# Patient Record
Sex: Female | Born: 2000
Health system: Southern US, Community
[De-identification: ages and names within clinical notes are randomized; demographics above are authoritative.]

## PROBLEM LIST (undated history)

## (undated) DIAGNOSIS — F32A Depression, unspecified: Secondary | ICD-10-CM

## (undated) DIAGNOSIS — F419 Anxiety disorder, unspecified: Secondary | ICD-10-CM

## (undated) HISTORY — DX: Depression, unspecified: F32.A

## (undated) HISTORY — DX: Anxiety disorder, unspecified: F41.9

---
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
## 2021-01-05 DIAGNOSIS — F330 Major depressive disorder, recurrent, mild: Secondary | ICD-10-CM | POA: Diagnosis not present

## 2021-01-05 DIAGNOSIS — F4323 Adjustment disorder with mixed anxiety and depressed mood: Secondary | ICD-10-CM | POA: Diagnosis not present

## 2021-01-12 DIAGNOSIS — F4323 Adjustment disorder with mixed anxiety and depressed mood: Secondary | ICD-10-CM | POA: Diagnosis not present

## 2021-01-19 DIAGNOSIS — F4323 Adjustment disorder with mixed anxiety and depressed mood: Secondary | ICD-10-CM | POA: Diagnosis not present

## 2021-01-26 DIAGNOSIS — F4323 Adjustment disorder with mixed anxiety and depressed mood: Secondary | ICD-10-CM | POA: Diagnosis not present

## 2021-02-02 DIAGNOSIS — F4323 Adjustment disorder with mixed anxiety and depressed mood: Secondary | ICD-10-CM | POA: Diagnosis not present

## 2021-02-09 DIAGNOSIS — F4323 Adjustment disorder with mixed anxiety and depressed mood: Secondary | ICD-10-CM | POA: Diagnosis not present

## 2021-02-09 DIAGNOSIS — F330 Major depressive disorder, recurrent, mild: Secondary | ICD-10-CM | POA: Diagnosis not present

## 2021-02-25 ENCOUNTER — Other Ambulatory Visit: Payer: BC Managed Care – PPO

## 2021-08-19 ENCOUNTER — Ambulatory Visit: Payer: BC Managed Care – PPO | Admitting: Internal Medicine

## 2021-11-18 DIAGNOSIS — M25552 Pain in left hip: Secondary | ICD-10-CM | POA: Diagnosis not present

## 2021-11-18 DIAGNOSIS — F4323 Adjustment disorder with mixed anxiety and depressed mood: Secondary | ICD-10-CM | POA: Diagnosis not present

## 2021-11-24 DIAGNOSIS — R41840 Attention and concentration deficit: Secondary | ICD-10-CM | POA: Diagnosis not present

## 2021-11-25 DIAGNOSIS — F4323 Adjustment disorder with mixed anxiety and depressed mood: Secondary | ICD-10-CM | POA: Diagnosis not present

## 2021-12-02 DIAGNOSIS — F4323 Adjustment disorder with mixed anxiety and depressed mood: Secondary | ICD-10-CM | POA: Diagnosis not present

## 2021-12-09 DIAGNOSIS — F4323 Adjustment disorder with mixed anxiety and depressed mood: Secondary | ICD-10-CM | POA: Diagnosis not present

## 2021-12-16 DIAGNOSIS — F4323 Adjustment disorder with mixed anxiety and depressed mood: Secondary | ICD-10-CM | POA: Diagnosis not present

## 2021-12-23 DIAGNOSIS — F4323 Adjustment disorder with mixed anxiety and depressed mood: Secondary | ICD-10-CM | POA: Diagnosis not present

## 2022-01-06 DIAGNOSIS — F4323 Adjustment disorder with mixed anxiety and depressed mood: Secondary | ICD-10-CM | POA: Diagnosis not present

## 2022-01-11 DIAGNOSIS — J069 Acute upper respiratory infection, unspecified: Secondary | ICD-10-CM | POA: Diagnosis not present

## 2022-01-13 DIAGNOSIS — F603 Borderline personality disorder: Secondary | ICD-10-CM | POA: Diagnosis not present

## 2022-01-27 DIAGNOSIS — F603 Borderline personality disorder: Secondary | ICD-10-CM | POA: Diagnosis not present

## 2022-01-31 DIAGNOSIS — F411 Generalized anxiety disorder: Secondary | ICD-10-CM | POA: Diagnosis not present

## 2022-01-31 DIAGNOSIS — G44209 Tension-type headache, unspecified, not intractable: Secondary | ICD-10-CM | POA: Diagnosis not present

## 2022-01-31 DIAGNOSIS — M25532 Pain in left wrist: Secondary | ICD-10-CM | POA: Diagnosis not present

## 2022-01-31 DIAGNOSIS — R42 Dizziness and giddiness: Secondary | ICD-10-CM | POA: Diagnosis not present

## 2022-01-31 DIAGNOSIS — R7301 Impaired fasting glucose: Secondary | ICD-10-CM | POA: Diagnosis not present

## 2022-01-31 DIAGNOSIS — R55 Syncope and collapse: Secondary | ICD-10-CM | POA: Diagnosis not present

## 2022-02-01 DIAGNOSIS — G44209 Tension-type headache, unspecified, not intractable: Secondary | ICD-10-CM | POA: Diagnosis not present

## 2022-02-01 DIAGNOSIS — R42 Dizziness and giddiness: Secondary | ICD-10-CM | POA: Diagnosis not present

## 2022-02-01 DIAGNOSIS — Z23 Encounter for immunization: Secondary | ICD-10-CM | POA: Diagnosis not present

## 2022-02-01 DIAGNOSIS — R55 Syncope and collapse: Secondary | ICD-10-CM | POA: Diagnosis not present

## 2022-02-10 DIAGNOSIS — F603 Borderline personality disorder: Secondary | ICD-10-CM | POA: Diagnosis not present

## 2022-02-17 DIAGNOSIS — F603 Borderline personality disorder: Secondary | ICD-10-CM | POA: Diagnosis not present

## 2022-02-24 DIAGNOSIS — F603 Borderline personality disorder: Secondary | ICD-10-CM | POA: Diagnosis not present

## 2022-03-02 DIAGNOSIS — F411 Generalized anxiety disorder: Secondary | ICD-10-CM | POA: Diagnosis not present

## 2022-03-03 DIAGNOSIS — F603 Borderline personality disorder: Secondary | ICD-10-CM | POA: Diagnosis not present

## 2022-03-23 DIAGNOSIS — F603 Borderline personality disorder: Secondary | ICD-10-CM | POA: Diagnosis not present

## 2022-04-01 DIAGNOSIS — F603 Borderline personality disorder: Secondary | ICD-10-CM | POA: Diagnosis not present

## 2022-04-08 DIAGNOSIS — F603 Borderline personality disorder: Secondary | ICD-10-CM | POA: Diagnosis not present

## 2022-04-15 DIAGNOSIS — F603 Borderline personality disorder: Secondary | ICD-10-CM | POA: Diagnosis not present

## 2022-04-22 DIAGNOSIS — F603 Borderline personality disorder: Secondary | ICD-10-CM | POA: Diagnosis not present

## 2022-05-04 DIAGNOSIS — F603 Borderline personality disorder: Secondary | ICD-10-CM | POA: Diagnosis not present

## 2022-05-13 DIAGNOSIS — F603 Borderline personality disorder: Secondary | ICD-10-CM | POA: Diagnosis not present

## 2022-05-19 DIAGNOSIS — F603 Borderline personality disorder: Secondary | ICD-10-CM | POA: Diagnosis not present

## 2022-05-27 DIAGNOSIS — F603 Borderline personality disorder: Secondary | ICD-10-CM | POA: Diagnosis not present

## 2022-06-03 DIAGNOSIS — F603 Borderline personality disorder: Secondary | ICD-10-CM | POA: Diagnosis not present

## 2022-06-10 DIAGNOSIS — F603 Borderline personality disorder: Secondary | ICD-10-CM | POA: Diagnosis not present

## 2022-06-22 DIAGNOSIS — F603 Borderline personality disorder: Secondary | ICD-10-CM | POA: Diagnosis not present

## 2022-07-01 DIAGNOSIS — F603 Borderline personality disorder: Secondary | ICD-10-CM | POA: Diagnosis not present

## 2022-07-08 DIAGNOSIS — F603 Borderline personality disorder: Secondary | ICD-10-CM | POA: Diagnosis not present

## 2022-07-11 DIAGNOSIS — F603 Borderline personality disorder: Secondary | ICD-10-CM | POA: Diagnosis not present

## 2022-07-14 DIAGNOSIS — F603 Borderline personality disorder: Secondary | ICD-10-CM | POA: Diagnosis not present

## 2022-07-15 DIAGNOSIS — G4700 Insomnia, unspecified: Secondary | ICD-10-CM | POA: Diagnosis not present

## 2022-07-15 DIAGNOSIS — E063 Autoimmune thyroiditis: Secondary | ICD-10-CM | POA: Diagnosis not present

## 2022-07-15 DIAGNOSIS — F411 Generalized anxiety disorder: Secondary | ICD-10-CM | POA: Diagnosis not present

## 2022-07-18 DIAGNOSIS — R5383 Other fatigue: Secondary | ICD-10-CM | POA: Diagnosis not present

## 2022-07-18 DIAGNOSIS — E063 Autoimmune thyroiditis: Secondary | ICD-10-CM | POA: Diagnosis not present

## 2022-07-18 DIAGNOSIS — Z1321 Encounter for screening for nutritional disorder: Secondary | ICD-10-CM | POA: Diagnosis not present

## 2022-08-10 DIAGNOSIS — Z0000 Encounter for general adult medical examination without abnormal findings: Secondary | ICD-10-CM | POA: Diagnosis not present

## 2022-08-10 DIAGNOSIS — Z713 Dietary counseling and surveillance: Secondary | ICD-10-CM | POA: Diagnosis not present

## 2022-08-10 DIAGNOSIS — Z113 Encounter for screening for infections with a predominantly sexual mode of transmission: Secondary | ICD-10-CM | POA: Diagnosis not present

## 2022-08-10 DIAGNOSIS — Z1330 Encounter for screening examination for mental health and behavioral disorders, unspecified: Secondary | ICD-10-CM | POA: Diagnosis not present

## 2022-08-10 DIAGNOSIS — Z7189 Other specified counseling: Secondary | ICD-10-CM | POA: Diagnosis not present

## 2022-08-11 DIAGNOSIS — F603 Borderline personality disorder: Secondary | ICD-10-CM | POA: Diagnosis not present

## 2022-08-16 DIAGNOSIS — F603 Borderline personality disorder: Secondary | ICD-10-CM | POA: Diagnosis not present

## 2022-08-24 DIAGNOSIS — F603 Borderline personality disorder: Secondary | ICD-10-CM | POA: Diagnosis not present

## 2022-09-01 DIAGNOSIS — F603 Borderline personality disorder: Secondary | ICD-10-CM | POA: Diagnosis not present

## 2022-09-07 DIAGNOSIS — F603 Borderline personality disorder: Secondary | ICD-10-CM | POA: Diagnosis not present

## 2022-09-21 DIAGNOSIS — F603 Borderline personality disorder: Secondary | ICD-10-CM | POA: Diagnosis not present

## 2022-09-29 DIAGNOSIS — F603 Borderline personality disorder: Secondary | ICD-10-CM | POA: Diagnosis not present

## 2022-10-06 DIAGNOSIS — F603 Borderline personality disorder: Secondary | ICD-10-CM | POA: Diagnosis not present

## 2022-10-13 DIAGNOSIS — F603 Borderline personality disorder: Secondary | ICD-10-CM | POA: Diagnosis not present

## 2022-10-18 DIAGNOSIS — Z23 Encounter for immunization: Secondary | ICD-10-CM | POA: Diagnosis not present

## 2022-10-20 DIAGNOSIS — F603 Borderline personality disorder: Secondary | ICD-10-CM | POA: Diagnosis not present

## 2022-11-10 DIAGNOSIS — F603 Borderline personality disorder: Secondary | ICD-10-CM | POA: Diagnosis not present

## 2022-11-16 DIAGNOSIS — F603 Borderline personality disorder: Secondary | ICD-10-CM | POA: Diagnosis not present

## 2022-11-23 DIAGNOSIS — F603 Borderline personality disorder: Secondary | ICD-10-CM | POA: Diagnosis not present

## 2022-11-30 DIAGNOSIS — F603 Borderline personality disorder: Secondary | ICD-10-CM | POA: Diagnosis not present

## 2022-12-07 DIAGNOSIS — F603 Borderline personality disorder: Secondary | ICD-10-CM | POA: Diagnosis not present

## 2022-12-21 DIAGNOSIS — F603 Borderline personality disorder: Secondary | ICD-10-CM | POA: Diagnosis not present

## 2022-12-28 DIAGNOSIS — F603 Borderline personality disorder: Secondary | ICD-10-CM | POA: Diagnosis not present

## 2023-01-11 DIAGNOSIS — F603 Borderline personality disorder: Secondary | ICD-10-CM | POA: Diagnosis not present

## 2023-01-18 DIAGNOSIS — F603 Borderline personality disorder: Secondary | ICD-10-CM | POA: Diagnosis not present

## 2023-01-25 DIAGNOSIS — F603 Borderline personality disorder: Secondary | ICD-10-CM | POA: Diagnosis not present

## 2023-02-01 DIAGNOSIS — F603 Borderline personality disorder: Secondary | ICD-10-CM | POA: Diagnosis not present

## 2023-02-08 DIAGNOSIS — F603 Borderline personality disorder: Secondary | ICD-10-CM | POA: Diagnosis not present

## 2023-02-15 DIAGNOSIS — F603 Borderline personality disorder: Secondary | ICD-10-CM | POA: Diagnosis not present

## 2023-02-22 DIAGNOSIS — F603 Borderline personality disorder: Secondary | ICD-10-CM | POA: Diagnosis not present

## 2023-03-01 DIAGNOSIS — F603 Borderline personality disorder: Secondary | ICD-10-CM | POA: Diagnosis not present

## 2023-03-23 DIAGNOSIS — F603 Borderline personality disorder: Secondary | ICD-10-CM | POA: Diagnosis not present

## 2023-04-06 DIAGNOSIS — F603 Borderline personality disorder: Secondary | ICD-10-CM | POA: Diagnosis not present

## 2023-04-20 DIAGNOSIS — F603 Borderline personality disorder: Secondary | ICD-10-CM | POA: Diagnosis not present

## 2023-04-25 DIAGNOSIS — F603 Borderline personality disorder: Secondary | ICD-10-CM | POA: Diagnosis not present

## 2023-05-04 DIAGNOSIS — F603 Borderline personality disorder: Secondary | ICD-10-CM | POA: Diagnosis not present

## 2023-05-25 DIAGNOSIS — F603 Borderline personality disorder: Secondary | ICD-10-CM | POA: Diagnosis not present

## 2023-06-15 DIAGNOSIS — F603 Borderline personality disorder: Secondary | ICD-10-CM | POA: Diagnosis not present

## 2023-06-29 DIAGNOSIS — F603 Borderline personality disorder: Secondary | ICD-10-CM | POA: Diagnosis not present

## 2023-07-14 DIAGNOSIS — F603 Borderline personality disorder: Secondary | ICD-10-CM | POA: Diagnosis not present

## 2023-07-18 DIAGNOSIS — F603 Borderline personality disorder: Secondary | ICD-10-CM | POA: Diagnosis not present

## 2023-07-27 DIAGNOSIS — F603 Borderline personality disorder: Secondary | ICD-10-CM | POA: Diagnosis not present

## 2023-09-01 DIAGNOSIS — F603 Borderline personality disorder: Secondary | ICD-10-CM | POA: Diagnosis not present

## 2023-10-06 DIAGNOSIS — F603 Borderline personality disorder: Secondary | ICD-10-CM | POA: Diagnosis not present

## 2023-10-13 ENCOUNTER — Encounter: Payer: Self-pay | Admitting: Internal Medicine

## 2023-10-13 ENCOUNTER — Other Ambulatory Visit (HOSPITAL_COMMUNITY)
Admission: RE | Admit: 2023-10-13 | Discharge: 2023-10-13 | Disposition: A | Discharge status: Home / Self Care | Source: Ambulatory Visit | Attending: Internal Medicine | Admitting: Internal Medicine

## 2023-10-13 ENCOUNTER — Ambulatory Visit: Admitting: Internal Medicine

## 2023-10-13 ENCOUNTER — Other Ambulatory Visit: Payer: Self-pay

## 2023-10-13 VITALS — BP 120/70 | HR 97 | Temp 98.0°F | Resp 16 | Ht 62.0 in | Wt 139.1 lb

## 2023-10-13 DIAGNOSIS — Z0000 Encounter for general adult medical examination without abnormal findings: Secondary | ICD-10-CM | POA: Diagnosis not present

## 2023-10-13 DIAGNOSIS — Z1159 Encounter for screening for other viral diseases: Secondary | ICD-10-CM | POA: Diagnosis not present

## 2023-10-13 DIAGNOSIS — F419 Anxiety disorder, unspecified: Secondary | ICD-10-CM | POA: Diagnosis not present

## 2023-10-13 DIAGNOSIS — Z113 Encounter for screening for infections with a predominantly sexual mode of transmission: Secondary | ICD-10-CM | POA: Diagnosis not present

## 2023-10-13 DIAGNOSIS — G4700 Insomnia, unspecified: Secondary | ICD-10-CM | POA: Diagnosis not present

## 2023-10-13 DIAGNOSIS — Z8349 Family history of other endocrine, nutritional and metabolic diseases: Secondary | ICD-10-CM | POA: Diagnosis not present

## 2023-10-13 DIAGNOSIS — Z3041 Encounter for surveillance of contraceptive pills: Secondary | ICD-10-CM

## 2023-10-13 MED ORDER — CLONIDINE HCL 0.2 MG PO TABS: 0.2000 mg | ORAL_TABLET | Freq: Every day | ORAL | 3 refills | Status: AC

## 2023-10-13 MED ORDER — TRI-SPRINTEC 0.18/0.215/0.25 MG-35 MCG PO TABS: 1.0000 | ORAL_TABLET | Freq: Every day | ORAL | 11 refills | Status: AC

## 2023-10-13 NOTE — Progress Notes (Signed)
 New Patient Office Visit  Subjective    Patient ID: Brandy Lucero, female    DOB: 2000-12-02  Age: 23 y.o. MRN: [id number]  CC:  Chief Complaint  Patient presents with   Establish Care    HPI Brandy Lucero presents to establish care.  Contraception:  -Currently on Tri-Sprintec -Has been on for 2 years without complications -Started taking for acne, wants to continue for now but my eventually try to discontinue birth control  Anxiety/Insomnia: -Currently on Clonidine 0.2 mg, Hydroxyzine 25 mg at bedtime for sleep. Doing well.  -Had previously been on Wellbutrin and Topamax but no longer taking  Family History of Thyroid  Disease: -Uncertain of the specifics but her mother and aunt suffer from thyroid  disease and she's been monitoring her thyroid  levels -Last checked in 11/23 2.440  Health Maintenance:  -Blood work UTD -Pap due -Tdap due   Outpatient Encounter Medications as of 10/13/2023  Medication Sig   cloNIDine (CATAPRES) 0.2 MG tablet Take 0.2 mg by mouth daily.   hydrOXYzine (ATARAX/VISTARIL) 25 MG tablet Take 25-50 mg by mouth at bedtime as needed.   TRI-SPRINTEC 0.18/0.215/0.25 MG-35 MCG tablet Take 1 tablet by mouth daily.   buPROPion (WELLBUTRIN XL) 150 MG 24 hr tablet  (Patient not taking: Reported on 10/13/2023)   topiramate (TOPAMAX) 25 MG tablet Take 25 mg by mouth daily. (Patient not taking: Reported on 10/13/2023)   No facility-administered encounter medications on file as of 10/13/2023.    Past Medical History:  Diagnosis Date   Anxiety    Depression     No past surgical history on file.  Family History  Problem Relation Age of Onset   Thyroid  disease Mother    Thyroid  disease Maternal Aunt    Thyroid  disease Maternal Grandmother     Social History   Socioeconomic History   Marital status: Single    Spouse name: Not on file   Number of children: 0   Years of education: Not on file   Highest education level: Bachelor's degree (e.g., BA,  AB, BS)  Occupational History   Occupation: Student  Tobacco Use   Smoking status: Never   Smokeless tobacco: Never  Vaping Use   Vaping status: Never Used  Substance and Sexual Activity   Alcohol use: Never   Drug use: Never   Sexual activity: Yes  Other Topics Concern   Not on file  Social History Narrative   Not on file   Social Drivers of Health   Financial Resource Strain: Low Risk  (10/11/2023)   Overall Financial Resource Strain (CARDIA)    Difficulty of Paying Living Expenses: Not very hard  Food Insecurity: No Food Insecurity (10/11/2023)   Hunger Vital Sign    Worried About Running Out of Food in the Last Year: Never true    Ran Out of Food in the Last Year: Never true  Transportation Needs: No Transportation Needs (10/11/2023)   PRAPARE - Administrator, Civil Service (Medical): No    Lack of Transportation (Non-Medical): No  Physical Activity: Sufficiently Active (10/11/2023)   Exercise Vital Sign    Days of Exercise per Week: 6 days    Minutes of Exercise per Session: 60 min  Stress: No Stress Concern Present (10/11/2023)   [employer] of Occupational Health - Occupational Stress Questionnaire    Feeling of Stress: Not at all  Social Connections: Moderately Integrated (10/11/2023)   Social Connection and Isolation Panel    Frequency of Communication  with Friends and Family: More than three times a week    Frequency of Social Gatherings with Friends and Family: Once a week    Attends Religious Services: More than 4 times per year    Active Member of [employer] or Organizations: No    Attends Banker Meetings: Not on file    Marital Status: Living with partner  Intimate Partner Violence: Unknown (12/09/2019)   Received from [facility]   Intimate Partner Violence    Fear of Current or Ex-Partner: Not asked    Emotionally Abused: Not asked    Physically Abused: Not asked    Sexually Abused: Not asked    Review of Systems  All other  systems reviewed and are negative.       Objective    BP 120/70 (Cuff Size: Normal)   Pulse 97   Temp 98 F (36.7 C) (Oral)   Resp 16   Ht 5' 2 (1.575 m)   Wt 139 lb 1.6 oz (63.1 kg)   LMP 10/09/2023   SpO2 94%   BMI 25.44 kg/m   Physical Exam Constitutional:      Appearance: Normal appearance.  HENT:     Head: Normocephalic and atraumatic.     Mouth/Throat:     Mouth: Mucous membranes are moist.     Pharynx: Oropharynx is clear.  Eyes:     Extraocular Movements: Extraocular movements intact.     Conjunctiva/sclera: Conjunctivae normal.     Pupils: Pupils are equal, round, and reactive to light.  Neck:     Comments: No thyromegaly Cardiovascular:     Rate and Rhythm: Normal rate and regular rhythm.  Pulmonary:     Effort: Pulmonary effort is normal.     Breath sounds: Normal breath sounds.  Musculoskeletal:     Cervical back: No tenderness.     Right lower leg: No edema.     Left lower leg: No edema.  Lymphadenopathy:     Cervical: No cervical adenopathy.  Skin:    General: Skin is warm and dry.  Neurological:     General: No focal deficit present.     Mental Status: She is alert. Mental status is at baseline.  Psychiatric:        Mood and Affect: Mood normal.        Behavior: Behavior normal.         Assessment & Plan:   Assessment & Plan Annual Physical/Adult Wellness Visit New adult patient transitioning from pediatric care. Discussed health maintenance, vaccinations, and screenings. - Perform physical exam. - Order labs for renal, hepatic, thyroid  function, electrolytes, and anemia. - Screen for hepatitis C. - Conduct STD screening with vaginal swab. - Discuss Pap smear for next visit. - Review and update tetanus vaccine if needed.  Contraceptive management (oral) On Trisprintec for contraception and acne. Satisfied but considering future discontinuation. Benefits and risks discussed. Blood pressure controlled at 120/70 mmHg. No current  gynecologist, willing to continue prescription here. - Continue Trisprintec. - Monitor for thromboembolic events and side effects. - Discuss future discontinuation if desired.  Generalized anxiety disorder with insomnia Depression and anxiety with insomnia. Previously on Wellbutrin and Topamax, now on clonidine and hydroxyzine. Considering tapering but cautious. Medications well-tolerated, no daytime sedation, blood pressure controlled. - Continue clonidine and hydroxyzine for one year. - Consider tapering hydroxyzine if stable. - Monitor for return of anxiety or insomnia symptoms.  - CBC w/Diff/Platelet - Comprehensive Metabolic Panel (CMET) - TSH - HIV antibody (  with reflex) - Cervicovaginal ancillary only - TRI-SPRINTEC 0.18/0.215/0.25 MG-35 MCG tablet; Take 1 tablet by mouth daily.  Dispense: 28 tablet; Refill: 11 - Hepatitis C Antibody - cloNIDine (CATAPRES) 0.2 MG tablet; Take 1 tablet (0.2 mg total) by mouth daily.  Dispense: 90 tablet; Refill: 3 - TSH   Return in about 1 year (around 10/12/2024) for physical w/Pap.   Sharyle Fischer, DO

## 2023-10-14 LAB — CBC WITH DIFFERENTIAL/PLATELET
Absolute Lymphocytes: 2472 {cells}/uL (ref 850–3900)
Absolute Monocytes: 331 {cells}/uL (ref 200–950)
Basophils Absolute: 54 {cells}/uL (ref 0–200)
Basophils Relative: 0.7 %
Eosinophils Absolute: 824 {cells}/uL — ABNORMAL HIGH (ref 15–500)
Eosinophils Relative: 10.7 %
HCT: 44.8 % (ref 35.0–45.0)
Hemoglobin: 14.5 g/dL (ref 11.7–15.5)
MCH: 29.5 pg (ref 27.0–33.0)
MCHC: 32.4 g/dL (ref 32.0–36.0)
MCV: 91.1 fL (ref 80.0–100.0)
MPV: 10.8 fL (ref 7.5–12.5)
Monocytes Relative: 4.3 %
Neutro Abs: 4019 {cells}/uL (ref 1500–7800)
Neutrophils Relative %: 52.2 %
Platelets: 362 Thousand/uL (ref 140–400)
RBC: 4.92 Million/uL (ref 3.80–5.10)
RDW: 11.8 % (ref 11.0–15.0)
Total Lymphocyte: 32.1 %
WBC: 7.7 Thousand/uL (ref 3.8–10.8)

## 2023-10-14 LAB — COMPREHENSIVE METABOLIC PANEL WITH GFR
AG Ratio: 1.8 (calc) (ref 1.0–2.5)
ALT: 14 U/L (ref 6–29)
AST: 17 U/L (ref 10–30)
Albumin: 4.8 g/dL (ref 3.6–5.1)
Alkaline phosphatase (APISO): 51 U/L (ref 31–125)
BUN/Creatinine Ratio: 17 (calc) (ref 6–22)
BUN: 17 mg/dL (ref 7–25)
CO2: 28 mmol/L (ref 20–32)
Calcium: 9.7 mg/dL (ref 8.6–10.2)
Chloride: 104 mmol/L (ref 98–110)
Creat: 1.02 mg/dL — ABNORMAL HIGH (ref 0.50–0.96)
Globulin: 2.6 g/dL (ref 1.9–3.7)
Glucose, Bld: 97 mg/dL (ref 65–99)
Potassium: 4.5 mmol/L (ref 3.5–5.3)
Sodium: 139 mmol/L (ref 135–146)
Total Bilirubin: 0.8 mg/dL (ref 0.2–1.2)
Total Protein: 7.4 g/dL (ref 6.1–8.1)
eGFR: 80 mL/min/1.73m2 (ref >=60)

## 2023-10-14 LAB — HIV ANTIBODY (ROUTINE TESTING W REFLEX): HIV 1&2 Ab, 4th Generation: NONREACTIVE

## 2023-10-14 LAB — HEPATITIS C ANTIBODY: Hepatitis C Ab: NONREACTIVE

## 2023-10-14 LAB — TSH: TSH: 2.66 m[IU]/L

## 2023-10-16 ENCOUNTER — Ambulatory Visit: Payer: Self-pay | Admitting: Internal Medicine

## 2023-10-17 LAB — CERVICOVAGINAL ANCILLARY ONLY
Bacterial Vaginitis (gardnerella): NEGATIVE
Chlamydia: NEGATIVE
Comment: NEGATIVE
Comment: NEGATIVE
Comment: NEGATIVE
Comment: NORMAL
Neisseria Gonorrhea: NEGATIVE
Trichomonas: NEGATIVE

## 2023-11-10 DIAGNOSIS — F603 Borderline personality disorder: Secondary | ICD-10-CM | POA: Diagnosis not present

## 2024-01-05 DIAGNOSIS — F603 Borderline personality disorder: Secondary | ICD-10-CM | POA: Diagnosis not present

## 2024-02-16 DIAGNOSIS — F603 Borderline personality disorder: Secondary | ICD-10-CM | POA: Diagnosis not present

## 2024-10-14 ENCOUNTER — Encounter: Admitting: Internal Medicine
